# Patient Record
Sex: Female | Born: 2000 | Race: Black or African American | Hispanic: No | Marital: Single | Smoking: Never smoker
Health system: Southern US, Community
[De-identification: ages and names within clinical notes are randomized; demographics above are authoritative.]

## PROBLEM LIST (undated history)

## (undated) DIAGNOSIS — J302 Other seasonal allergic rhinitis: Secondary | ICD-10-CM

## (undated) HISTORY — PX: NO PAST SURGERIES: SHX2092

---
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
## 2022-01-03 ENCOUNTER — Ambulatory Visit (INDEPENDENT_AMBULATORY_CARE_PROVIDER_SITE_OTHER): Payer: Medicaid Other | Admitting: Clinical

## 2022-01-03 DIAGNOSIS — F4322 Adjustment disorder with anxiety: Secondary | ICD-10-CM | POA: Diagnosis not present

## 2022-01-03 NOTE — Patient Instructions (Signed)
[facility] [address] [place], [place] [address] [phone] (main office) [phone] (Vona Whiters's office)   

## 2022-02-05 ENCOUNTER — Ambulatory Visit (INDEPENDENT_AMBULATORY_CARE_PROVIDER_SITE_OTHER): Payer: Medicaid Other | Admitting: Family Medicine

## 2022-02-05 ENCOUNTER — Encounter: Payer: Self-pay | Admitting: Family Medicine

## 2022-02-05 ENCOUNTER — Other Ambulatory Visit: Payer: Self-pay

## 2022-02-05 VITALS — BP 119/67 | HR 106 | Wt 208.0 lb

## 2022-02-05 DIAGNOSIS — F53 Postpartum depression: Secondary | ICD-10-CM | POA: Diagnosis not present

## 2022-02-05 NOTE — Assessment & Plan Note (Signed)
Doing great, Edinburgh score 2, cont zoloft, discussed improved outcomes with staying on for a year, she will consider.

## 2022-02-05 NOTE — Progress Notes (Signed)
[facility]OM+BABY [facility]BINED [facility] GYNECOLOGY OFFICE VISIT NOTE  History:   Samantha Arnold is a 21 y.o. G1P1001 here today for mood check.  Had positive Edinburgh at postpartum visit in 10/2021 Started on zoloft At infant's last [facility] reported she was doing well  Today reports she feels a lot better Taking zoloft 25 mg daily Feels like it is helping Still using hydroxyzine occasionally  Health Maintenance Due  Topic Date Due   INFLUENZA VACCINE  Never done    Past Medical History:  Diagnosis Date   Seasonal allergies     Past Surgical History:  Procedure Laterality Date   NO PAST SURGERIES      The following portions of the patient's history were reviewed and updated as appropriate: allergies, current medications, past family history, past medical history, past social history, past surgical history and problem list.   Health Maintenance:   Last pap: No results found for: "DIAGPAP", "HPV", "[place]" N/a  Last mammogram:  N/a    Review of Systems:  Pertinent items noted in HPI and remainder of comprehensive ROS otherwise negative.  Physical Exam:  BP 119/67   Pulse (!) 106   Wt 208 lb (94.3 kg)   LMP  (LMP Unknown)   Breastfeeding No   BMI 39.30 kg/m  CONSTITUTIONAL: Well-developed, well-nourished female in no acute distress.  HEENT:  Normocephalic, atraumatic. External right and left ear normal. No scleral icterus.  NECK: Normal range of motion, supple, no masses noted on observation SKIN: No rash noted. Not diaphoretic. No erythema. No pallor. MUSCULOSKELETAL: Normal range of motion. No edema noted. NEUROLOGIC: Alert and oriented to person, place, and time. Normal muscle tone coordination.  PSYCHIATRIC: Normal mood and affect. Normal behavior. Normal judgment and thought content. RESPIRATORY: Effort normal, no problems with respiration noted  Labs and Imaging No results found for this or any previous visit (from the past 168 hour(s)). No results found.          02/05/2022   11:57 AM 11/05/2021   11:19 AM 10/10/2021    4:46 PM 10/02/2021   11:30 AM  Edinburgh Postnatal Depression Scale Screening Tool  I have been able to laugh and see the funny side of things. 0 0 1 0  I have looked forward with enjoyment to things. 0 0 0 0  I have blamed myself unnecessarily when things went wrong. 0 2 1 1   I have been anxious or worried for no good reason. 1 3 2 2   I have felt scared or panicky for no good reason. 1 3 2 2   Things have been getting on top of me. 0 0 2 1  I have been so unhappy that I have had difficulty sleeping. 0 2 1 0  I have felt sad or miserable. 0 0 1 0  I have been so unhappy that I have been crying. 0 2 1 0  The thought of harming myself has occurred to me. 0 0 0 0  Edinburgh Postnatal Depression Scale Total 2 12 11 6      Assessment and Plan:   Problem List Items Addressed This Visit       Other   Postpartum depression - Primary    Doing great, Edinburgh score 2, cont zoloft, discussed improved outcomes with staying on for a year, she will consider.        Routine preventative health maintenance measures emphasized. Please refer to After Visit Summary for other counseling recommendations.   Return if symptoms worsen or  fail to improve.    Total face-to-face time with patient: 15 minutes.  Over 50% of encounter was spent on counseling and coordination of care.   Venora Maples, MD/MPH Attending Family Medicine Physician, [facility] for [facility], [facility]

## 2022-06-26 DIAGNOSIS — Z111 Encounter for screening for respiratory tuberculosis: Secondary | ICD-10-CM | POA: Diagnosis not present

## 2022-06-29 DIAGNOSIS — Z0279 Encounter for issue of other medical certificate: Secondary | ICD-10-CM | POA: Diagnosis not present

## 2022-11-14 NOTE — Telephone Encounter (Signed)
See routing comment from provider for message information.

## 2023-01-10 ENCOUNTER — Telehealth: Payer: Medicaid Other | Admitting: Family Medicine

## 2023-01-10 DIAGNOSIS — J069 Acute upper respiratory infection, unspecified: Secondary | ICD-10-CM | POA: Diagnosis not present

## 2023-01-10 NOTE — Progress Notes (Signed)
Virtual Visit Consent   Samantha Arnold, you are scheduled for a virtual visit with a [facility] provider today. Just as with appointments in the office, your consent must be obtained to participate. Your consent will be active for this visit and any virtual visit you may have with one of our providers in the next 365 days. If you have a MyChart account, a copy of this consent can be sent to you electronically.  As this is a virtual visit, video technology does not allow for your provider to perform a traditional examination. This may limit your provider's ability to fully assess your condition. If your provider identifies any concerns that need to be evaluated in person or the need to arrange testing (such as labs, EKG, etc.), we will make arrangements to do so. Although advances in technology are sophisticated, we cannot ensure that it will always work on either your end or our end. If the connection with a video visit is poor, the visit may have to be switched to a telephone visit. With either a video or telephone visit, we are not always able to ensure that we have a secure connection.  By engaging in this virtual visit, you consent to the provision of healthcare and authorize for your insurance to be billed (if applicable) for the services provided during this visit. Depending on your insurance coverage, you may receive a charge related to this service.  I need to obtain your verbal consent now. Are you willing to proceed with your visit today? Samantha Arnold has provided verbal consent on 01/10/2023 for a virtual visit (video or telephone). Samantha Curio, FNP  Date: 01/10/2023 10:04 AM  Virtual Visit via Video Note   I, Samantha Arnold, connected with  Samantha Arnold  ([id number], Jun 04, 2000) on 01/10/23 at 10:00 AM EDT by a video-enabled telemedicine application and verified that I am speaking with the correct person using two identifiers.  Location: Patient: Virtual Visit Location Patient:  Home Provider: Virtual Visit Location Provider: Home Office   I discussed the limitations of evaluation and management by telemedicine and the availability of in person appointments. The patient expressed understanding and agreed to proceed.    History of Present Illness: Samantha Arnold is a 22 yArnoldo. who identifies as a female who was assigned female at birth, and is being seen today for cough, fatigue, nausea, no fever. Requests a work note. Samantha Kitchen  HPI: HPI  Problems:  Patient Active Problem List   Diagnosis Date Noted   Postpartum depression 11/09/2021   Post-dates pregnancy 10/01/2021   GBS (group B Streptococcus carrier), +RV culture, currently pregnant 09/10/2021   Anemia during pregnancy in third trimester 06/30/2021   Anxiety and depression 04/17/2021   Alpha thalassemia silent carrier 03/27/2021   Opioid use disorder 03/07/2021    Allergies: No Known Allergies Medications:  Current Outpatient Medications:    hydrOXYzine (ATARAX) 25 MG tablet, Take 1 tablet (25 mg total) by mouth every 6 (six) hours as needed for anxiety., Disp: 30 tablet, Rfl: 2   sertraline (ZOLOFT) 25 MG tablet, Take 1 tablet (25 mg total) by mouth daily., Disp: 30 tablet, Rfl: 11  Observations/Objective: Patient is well-developed, well-nourished in no acute distress.  Resting comfortably  at home.  Head is normocephalic, atraumatic.  No labored breathing.  Speech is clear and coherent with logical content.  Patient is alert and oriented at baseline.    Assessment and Plan: 1. Viral URI with cough  Increase fluids, humidifier at  night, tylenol, UC if sx worsen.   Follow Up Instructions: I discussed the assessment and treatment plan with the patient. The patient was provided an opportunity to ask questions and all were answered. The patient agreed with the plan and demonstrated an understanding of the instructions.  A copy of instructions were sent to the patient via MyChart unless otherwise noted below.      The patient was advised to call back or seek an in-person evaluation if the symptoms worsen or if the condition fails to improve as anticipated.    Samantha Curio, FNP

## 2023-01-10 NOTE — Patient Instructions (Signed)

## 2023-02-20 ENCOUNTER — Other Ambulatory Visit: Payer: Self-pay

## 2023-02-20 ENCOUNTER — Encounter (HOSPITAL_COMMUNITY): Payer: Self-pay

## 2023-02-20 ENCOUNTER — Emergency Department (HOSPITAL_COMMUNITY)
Admission: EM | Admit: 2023-02-20 | Discharge: 2023-02-21 | Disposition: A | Discharge status: Home / Self Care | Payer: No Typology Code available for payment source | Attending: Emergency Medicine | Admitting: Emergency Medicine

## 2023-02-20 ENCOUNTER — Emergency Department (HOSPITAL_COMMUNITY): Payer: No Typology Code available for payment source

## 2023-02-20 DIAGNOSIS — S0990XA Unspecified injury of head, initial encounter: Secondary | ICD-10-CM | POA: Diagnosis not present

## 2023-02-20 DIAGNOSIS — S0993XA Unspecified injury of face, initial encounter: Secondary | ICD-10-CM | POA: Diagnosis present

## 2023-02-20 DIAGNOSIS — S00531A Contusion of lip, initial encounter: Secondary | ICD-10-CM | POA: Diagnosis not present

## 2023-02-20 DIAGNOSIS — R825 Elevated urine levels of drugs, medicaments and biological substances: Secondary | ICD-10-CM

## 2023-02-20 DIAGNOSIS — R519 Headache, unspecified: Secondary | ICD-10-CM | POA: Diagnosis not present

## 2023-02-20 DIAGNOSIS — S299XXA Unspecified injury of thorax, initial encounter: Secondary | ICD-10-CM | POA: Diagnosis not present

## 2023-02-20 DIAGNOSIS — Y92410 Unspecified street and highway as the place of occurrence of the external cause: Secondary | ICD-10-CM | POA: Insufficient documentation

## 2023-02-20 DIAGNOSIS — R9431 Abnormal electrocardiogram [ECG] [EKG]: Secondary | ICD-10-CM | POA: Diagnosis not present

## 2023-02-20 DIAGNOSIS — R918 Other nonspecific abnormal finding of lung field: Secondary | ICD-10-CM | POA: Diagnosis not present

## 2023-02-20 DIAGNOSIS — M25511 Pain in right shoulder: Secondary | ICD-10-CM | POA: Insufficient documentation

## 2023-02-20 DIAGNOSIS — S3992XA Unspecified injury of lower back, initial encounter: Secondary | ICD-10-CM | POA: Diagnosis not present

## 2023-02-20 DIAGNOSIS — M25572 Pain in left ankle and joints of left foot: Secondary | ICD-10-CM | POA: Insufficient documentation

## 2023-02-20 DIAGNOSIS — M25552 Pain in left hip: Secondary | ICD-10-CM | POA: Diagnosis not present

## 2023-02-20 DIAGNOSIS — R7309 Other abnormal glucose: Secondary | ICD-10-CM | POA: Insufficient documentation

## 2023-02-20 DIAGNOSIS — S3993XA Unspecified injury of pelvis, initial encounter: Secondary | ICD-10-CM | POA: Diagnosis not present

## 2023-02-20 DIAGNOSIS — S199XXA Unspecified injury of neck, initial encounter: Secondary | ICD-10-CM | POA: Diagnosis not present

## 2023-02-20 DIAGNOSIS — S3991XA Unspecified injury of abdomen, initial encounter: Secondary | ICD-10-CM | POA: Diagnosis not present

## 2023-02-20 LAB — COMPREHENSIVE METABOLIC PANEL
ALT: 16 U/L (ref 0–44)
AST: 24 U/L (ref 15–41)
Albumin: 3.9 g/dL (ref 3.5–5.0)
Alkaline Phosphatase: 63 U/L (ref 38–126)
Anion gap: 10 (ref 5–15)
BUN: 8 mg/dL (ref 6–20)
CO2: 22 mmol/L (ref 22–32)
Calcium: 9.5 mg/dL (ref 8.9–10.3)
Chloride: 107 mmol/L (ref 98–111)
Creatinine, Ser: 0.8 mg/dL (ref 0.44–1.00)
GFR, Estimated: >60 mL/min (ref >60)
Glucose, Bld: 92 mg/dL (ref 70–99)
Potassium: 3.8 mmol/L (ref 3.5–5.1)
Sodium: 139 mmol/L (ref 135–145)
Total Bilirubin: 0.7 mg/dL (ref <1.2)
Total Protein: 7 g/dL (ref 6.5–8.1)

## 2023-02-20 LAB — I-STAT CHEM 8, ED
BUN: 9 mg/dL (ref 6–20)
Calcium, Ion: 1.15 mmol/L (ref 1.15–1.40)
Chloride: 106 mmol/L (ref 98–111)
Creatinine, Ser: 0.9 mg/dL (ref 0.44–1.00)
Glucose, Bld: 85 mg/dL (ref 70–99)
HCT: 41 % (ref 36.0–46.0)
Hemoglobin: 13.9 g/dL (ref 12.0–15.0)
Potassium: 4.3 mmol/L (ref 3.5–5.1)
Sodium: 140 mmol/L (ref 135–145)
TCO2: 24 mmol/L (ref 22–32)

## 2023-02-20 LAB — SAMPLE TO BLOOD BANK

## 2023-02-20 LAB — PROTIME-INR
INR: 1.1 (ref 0.8–1.2)
Prothrombin Time: 14.4 s (ref 11.4–15.2)

## 2023-02-20 LAB — CBC
HCT: 38.5 % (ref 36.0–46.0)
Hemoglobin: 12.7 g/dL (ref 12.0–15.0)
MCH: 26.1 pg (ref 26.0–34.0)
MCHC: 33 g/dL (ref 30.0–36.0)
MCV: 79.1 fL — ABNORMAL LOW (ref 80.0–100.0)
Platelets: 286 10*3/uL (ref 150–400)
RBC: 4.87 MIL/uL (ref 3.87–5.11)
RDW: 13.6 % (ref 11.5–15.5)
WBC: 4.6 10*3/uL (ref 4.0–10.5)
nRBC: 0 % (ref 0.0–0.2)

## 2023-02-20 LAB — ETHANOL: Alcohol, Ethyl (B): 10 mg/dL — ABNORMAL HIGH (ref <10)

## 2023-02-20 LAB — I-STAT CG4 LACTIC ACID, ED: Lactic Acid, Venous: 2 mmol/L (ref 0.5–1.9)
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
## 2023-02-20 NOTE — ED Notes (Signed)
Patient transported to CT 
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
## 2023-02-21 ENCOUNTER — Emergency Department (HOSPITAL_COMMUNITY): Payer: No Typology Code available for payment source

## 2023-02-21 DIAGNOSIS — S3991XA Unspecified injury of abdomen, initial encounter: Secondary | ICD-10-CM | POA: Diagnosis not present

## 2023-02-21 DIAGNOSIS — M25572 Pain in left ankle and joints of left foot: Secondary | ICD-10-CM | POA: Diagnosis not present

## 2023-02-21 DIAGNOSIS — S199XXA Unspecified injury of neck, initial encounter: Secondary | ICD-10-CM | POA: Diagnosis not present

## 2023-02-21 DIAGNOSIS — S299XXA Unspecified injury of thorax, initial encounter: Secondary | ICD-10-CM | POA: Diagnosis not present

## 2023-02-21 DIAGNOSIS — S3993XA Unspecified injury of pelvis, initial encounter: Secondary | ICD-10-CM | POA: Diagnosis not present

## 2023-02-21 DIAGNOSIS — S3992XA Unspecified injury of lower back, initial encounter: Secondary | ICD-10-CM | POA: Diagnosis not present

## 2023-02-21 DIAGNOSIS — R918 Other nonspecific abnormal finding of lung field: Secondary | ICD-10-CM | POA: Diagnosis not present

## 2023-02-21 DIAGNOSIS — S0990XA Unspecified injury of head, initial encounter: Secondary | ICD-10-CM | POA: Diagnosis not present

## 2023-02-21 DIAGNOSIS — M25511 Pain in right shoulder: Secondary | ICD-10-CM | POA: Diagnosis not present

## 2023-02-21 LAB — CBG MONITORING, ED
Glucose-Capillary: 112 mg/dL — ABNORMAL HIGH (ref 70–99)
Glucose-Capillary: 60 mg/dL — ABNORMAL LOW (ref 70–99)
Glucose-Capillary: 71 mg/dL (ref 70–99)

## 2023-02-21 LAB — URINALYSIS, ROUTINE W REFLEX MICROSCOPIC
Bilirubin Urine: NEGATIVE
Glucose, UA: NEGATIVE mg/dL
Hgb urine dipstick: NEGATIVE
Ketones, ur: NEGATIVE mg/dL
Leukocytes,Ua: NEGATIVE
Nitrite: NEGATIVE
Protein, ur: NEGATIVE mg/dL
Specific Gravity, Urine: 1.024 (ref 1.005–1.030)
pH: 6 (ref 5.0–8.0)

## 2023-02-21 LAB — RAPID URINE DRUG SCREEN, HOSP PERFORMED
Amphetamines: NOT DETECTED
Barbiturates: NOT DETECTED
Benzodiazepines: POSITIVE — AB
Cocaine: NOT DETECTED
Opiates: NOT DETECTED
Tetrahydrocannabinol: NOT DETECTED

## 2023-02-21 LAB — I-STAT CG4 LACTIC ACID, ED: Lactic Acid, Venous: 0.5 mmol/L (ref 0.5–1.9)

## 2023-02-21 LAB — HCG, SERUM, QUALITATIVE: Preg, Serum: NEGATIVE

## 2023-02-21 MED ORDER — NALOXONE HCL 0.4 MG/ML IJ SOLN: 0.4000 mg | Freq: Once | INTRAMUSCULAR | Status: AC

## 2023-02-21 MED ORDER — METHOCARBAMOL 500 MG PO TABS: 500.0000 mg | ORAL_TABLET | Freq: Two times a day (BID) | ORAL | 0 refills | Status: DC | PRN

## 2023-02-21 MED ORDER — DEXTROSE 50 % IV SOLN
25.0000 mL | Freq: Once | INTRAVENOUS | Status: AC
  Administered 2023-02-21: 25 mL via INTRAVENOUS
  Filled 2023-02-21: qty 50

## 2023-02-21 MED ORDER — NALOXONE HCL 0.4 MG/ML IJ SOLN: 0.4000 mg | Freq: Once | INTRAMUSCULAR | Status: DC

## 2023-02-21 MED ORDER — IOHEXOL 350 MG/ML SOLN
75.0000 mL | Freq: Once | INTRAVENOUS | Status: AC | PRN
  Administered 2023-02-21: 75 mL via INTRAVENOUS

## 2023-02-21 MED ORDER — NALOXONE HCL 0.4 MG/ML IJ SOLN
INTRAMUSCULAR | Status: AC
  Administered 2023-02-21: 0.4 mg via INTRAVENOUS
  Filled 2023-02-21: qty 1

## 2023-02-21 MED ORDER — NALOXONE HCL 2 MG/2ML IJ SOSY: 1.0000 mg | PREFILLED_SYRINGE | Freq: Once | INTRAMUSCULAR | Status: DC
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
## 2023-02-21 NOTE — ED Notes (Signed)
Ambulated a great distance in ED to restroom. NO changes in heart rate or oxygen saturation. Pt able to maintain 02 of 100% room air and a heart rate @100bpm .

## 2023-02-21 NOTE — ED Notes (Signed)
Upon assessment of the patient, patient was apneic and difficult to arose. This nurse contacted the EDP Rancour MD and [facility]. Upon PA assessment of the patient, narcan 0.4 mg and blood glucose was verbally ordered. CBG was 71. After administration of narcan, patient woke up Aox3 disoriented to situation.

## 2023-02-21 NOTE — ED Notes (Signed)
Discharge instructions reviewed.   Newly prescribed medications discussed. Pharmacy verified for accuracy.   Opportunity for questions and concerns provided.   Alternate clothing provided as hers was cut.   Alert, oriented and escorted to vehicle via wheelchair.
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
## 2023-02-26 ENCOUNTER — Ambulatory Visit: Payer: Medicaid Other | Admitting: Family Medicine

## 2023-02-26 VITALS — BP 124/78 | HR 87 | Ht 61.0 in | Wt 184.0 lb

## 2023-02-26 DIAGNOSIS — S060X9A Concussion with loss of consciousness of unspecified duration, initial encounter: Secondary | ICD-10-CM | POA: Diagnosis not present

## 2023-02-26 DIAGNOSIS — S93492A Sprain of other ligament of left ankle, initial encounter: Secondary | ICD-10-CM | POA: Diagnosis not present

## 2023-02-26 MED ORDER — MELOXICAM 15 MG PO TABS: ORAL_TABLET | ORAL | 3 refills | Status: AC

## 2023-02-26 MED ORDER — NORTRIPTYLINE HCL 25 MG PO CAPS: 25.0000 mg | ORAL_CAPSULE | Freq: Every day | ORAL | 2 refills | Status: DC
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
## 2023-02-26 NOTE — Patient Instructions (Addendum)
Thank you for coming in today.   I've referred you to Physical Therapy.  Let us know if you don't hear from them in one week.   Check back in 2 weeks

## 2023-02-28 ENCOUNTER — Ambulatory Visit: Payer: No Typology Code available for payment source | Admitting: Physical Therapy

## 2023-03-03 ENCOUNTER — Encounter: Payer: Medicaid Other | Admitting: Family Medicine

## 2023-03-04 ENCOUNTER — Ambulatory Visit: Payer: Medicaid Other | Attending: Family Medicine | Admitting: Physical Therapy
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
## 2023-03-13 ENCOUNTER — Encounter: Payer: Medicaid Other | Admitting: Family Medicine
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
## 2023-03-20 ENCOUNTER — Ambulatory Visit: Payer: Medicaid Other | Admitting: Family Medicine

## 2023-03-20 VITALS — BP 138/88 | HR 90 | Ht 61.0 in | Wt 190.0 lb

## 2023-03-20 DIAGNOSIS — F32A Depression, unspecified: Secondary | ICD-10-CM | POA: Diagnosis not present

## 2023-03-20 DIAGNOSIS — S060X9D Concussion with loss of consciousness of unspecified duration, subsequent encounter: Secondary | ICD-10-CM

## 2023-03-20 DIAGNOSIS — F419 Anxiety disorder, unspecified: Secondary | ICD-10-CM | POA: Diagnosis not present

## 2023-03-20 MED ORDER — BUSPIRONE HCL 7.5 MG PO TABS: 7.5000 mg | ORAL_TABLET | Freq: Three times a day (TID) | ORAL | 3 refills | Status: AC | PRN

## 2023-03-20 NOTE — Patient Instructions (Signed)
 Thank you for coming in today.   Use buspar for anxiety as needed.   Return to work tomorrow.   Recheck with me in 1 month.   Return sooner if needed.   IF in 1 month you are totally better and no longer need buspar ok to cancel your visit.

## 2023-04-17 ENCOUNTER — Ambulatory Visit (INDEPENDENT_AMBULATORY_CARE_PROVIDER_SITE_OTHER): Payer: Medicaid Other | Admitting: Family Medicine

## 2023-04-17 VITALS — BP 138/88 | HR 90 | Ht 61.0 in | Wt 184.0 lb

## 2023-04-17 DIAGNOSIS — S060X9D Concussion with loss of consciousness of unspecified duration, subsequent encounter: Secondary | ICD-10-CM | POA: Diagnosis not present

## 2023-04-17 DIAGNOSIS — S93492A Sprain of other ligament of left ankle, initial encounter: Secondary | ICD-10-CM | POA: Diagnosis not present

## 2023-04-17 NOTE — Patient Instructions (Addendum)
 Thank you for coming in today.   Please work on the home exercises the athletic trainer went over with you:  View at www.my-exercise-code.com using code: [id number]  I recommend you obtained a compression sleeve to help with your joint problems. There are many options on the market however I recommend obtaining a Full Ankle Body Helix compression sleeve.  You can find information (including how to appropriate measure yourself for sizing) can be found at www.Body Grandrapidswifi.ch.  Many of these products are health savings account (HSA) eligible.   You can use the compression sleeve at any time throughout the day but is most important to use while being active as well as for 2 hours post-activity.   It is appropriate to ice following activity with the compression sleeve in place.   Check back as needed
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
